# Patient Record
Sex: Female | Born: 1970 | Race: White | Marital: Married
Health system: Midwestern US, Community
[De-identification: ages and names within clinical notes are randomized; demographics above are authoritative.]

## PROBLEM LIST (undated history)

## (undated) HISTORY — PX: HIP SURGERY: SHX245

## (undated) HISTORY — PX: CHOLECYSTECTOMY: SHX55

---

## 2014-04-12 NOTE — Telephone Encounter (Signed)
Last seen in May   Refill of clonazepam 0.5    Pt  [phone]     [facility] [phone]  Called oct 15 at 3:17p

## 2014-04-15 MED ORDER — CLONAZEPAM 0.5 MG TAB
0.5 mg | ORAL_TABLET | Freq: Every evening | ORAL | Status: AC | PRN
Start: 2014-04-15 — End: ?

## 2014-04-15 NOTE — Telephone Encounter (Signed)
Pt called and states that she needs refill on klonopin     Requested Prescriptions     Pending Prescriptions Disp Refills   ??? clonazePAM (KLONOPIN) 0.5 mg tablet 30 Tab 5     Sig: Take 1 Tab by mouth nightly as needed. Max Daily Amount: 0.5 mg.      Pt [phone] [facility] in chart   Dezmond Downie C [place]

## 2014-04-15 NOTE — Telephone Encounter (Signed)
Patient notified that Rx ready at front desk for pick up.

## 2014-04-16 NOTE — Telephone Encounter (Signed)
Didn't called pt with refusal. Approved with request sent by Erika Patel on 10/19  Erika Patel

## 2019-03-16 ENCOUNTER — Ambulatory Visit
Admission: EM | Admit: 2019-03-16 | Discharge: 2019-03-16 | Disposition: A | Discharge status: Home / Self Care | Payer: PRIVATE HEALTH INSURANCE | Attending: Family Medicine | Admitting: Family Medicine

## 2019-03-16 ENCOUNTER — Encounter: Payer: Self-pay | Admitting: Emergency Medicine

## 2019-03-16 ENCOUNTER — Other Ambulatory Visit: Payer: Self-pay

## 2019-03-16 ENCOUNTER — Ambulatory Visit (INDEPENDENT_AMBULATORY_CARE_PROVIDER_SITE_OTHER): Payer: PRIVATE HEALTH INSURANCE

## 2019-03-16 DIAGNOSIS — S0990XA Unspecified injury of head, initial encounter: Secondary | ICD-10-CM

## 2019-03-16 MED ORDER — MELOXICAM 15 MG PO TABS: 15.0000 mg | ORAL_TABLET | Freq: Every day | ORAL | 0 refills | Status: AC | PRN
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
## 2019-03-16 NOTE — Discharge Instructions (Signed)
Medication as needed.  Xray negative.  Take care  Dr. Lacinda Axon
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
## 2019-03-17 LAB — NOVEL CORONAVIRUS, NAA (HOSP ORDER, SEND-OUT TO REF LAB; TAT 18-24 HRS): SARS-CoV-2, NAA: NOT DETECTED

## 2019-03-19 ENCOUNTER — Encounter (HOSPITAL_COMMUNITY): Payer: Self-pay

## 2019-04-12 ENCOUNTER — Other Ambulatory Visit: Payer: Self-pay | Admitting: Family Medicine
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
